# Patient Record
Sex: Female | Born: 1990 | Race: White | Hispanic: No | Marital: Married
Health system: Midwestern US, Academic
[De-identification: ages and names within clinical notes are randomized; demographics above are authoritative.]

---

## 2018-11-06 ENCOUNTER — Encounter: Admit: 2018-11-06 | Discharge: 2018-11-07

## 2018-11-26 ENCOUNTER — Encounter: Admit: 2018-11-26 | Discharge: 2018-11-26 | Payer: Commercial Managed Care - HMO

## 2018-11-26 DIAGNOSIS — M25552 Pain in left hip: Secondary | ICD-10-CM

## 2018-11-26 NOTE — Progress Notes
Date of Service: 11/27/2018    History of Present Illness  Tonya Gonzalez is a 28 y.o. female who presents with c/o left hip pain for approximately 5-7 years.  She is referred by Jackquline Berlin, DO.  She rates her pain 5/10 today.  She is uncertain of an injury or event that preceded the onset of her symptoms.  She localizes the majority of her pain anteriorly deep in the crease of her groin, as well as occasional posterolateral hip discomfort today.  Denies mechanical symptoms, such as catching or locking, as well as instability.  She has noted increasing pain and discomfort, specifically over the past 2-3 months, but her pain has been present for a number of years.  Denies any signs or symptoms of radiculopathy.  Her pain is made worse when sitting for long periods of time, as well as climbing stairs, walking up and down hills, and running activities.  She has recently tried anti-inflammatories, which she feels has provided mild relief, but denies PT and/or injections.  Of note, she brings with her an outside MRI, without contrast, of her left hip for further review today.  Denies distant h/o serious left hip injury and/or dislocations.  She presents to clinic to pursue further management options.       Review of Systems   Musculoskeletal: Positive for arthralgias.     No past medical history on file.    No past surgical history on file.    Objective:         No current outpatient medications on file.     There were no vitals filed for this visit.  There is no height or weight on file to calculate BMI.     Social History     Socioeconomic History   ??? Marital status: Married     Spouse name: Not on file   ??? Number of children: Not on file   ??? Years of education: Not on file   ??? Highest education level: Not on file   Occupational History   ??? Not on file   Tobacco Use   ??? Smoking status: Not on file   Substance and Sexual Activity   ??? Alcohol use: Not on file   ??? Drug use: Not on file ??? Sexual activity: Not on file   Other Topics Concern   ??? Not on file   Social History Narrative   ??? Not on file     General Healthy appearing 28 y.o. female in no acute distress.  Alert and oriented x 3.  Mood and affect are appropriate today.    Constitutional:  She is oriented to person, place, and time.  She appears well-developed and well-nourished.   HENT:   Head: Normocephalic and atraumatic.   Eyes: Conjunctivae and EOM are normal.   Cardiovascular: Normal rate and intact distal pulses.   Pulmonary/Chest: Effort normal. No respiratory distress.   Neurological:  She is alert and oriented to person, place, and time.   Skin: Skin is warm and dry.   Psychiatric:  She has a normal mood and affect.  Her behavior is normal. Judgment and thought content normal.   Nursing note and vitals reviewed.    Physical Exam  Left Hip Exam     Tests   FABER: positive  Ober: negative    Other   Sensation: normal  Pulse: present    Comments:  Level pelvis with single limb stance.  Moderate dynamic valgus with hip abduction and quad dominance.  IR to 30 degrees, ER to 30 degrees, symmetric.  Log roll is positive.  Stinchfield's is negative.  Hip flexion to 130 degrees, without aggravation of anterior hip pain.  FADIR is positive. (Positive impingement sign)  Scour is positive.  Circumduction is positive for pain and snapping.  Resisted adduction is 5/5, with no proximal adductor longus tenderness.  Positive anterior apprehension test.  No tenderness over the pubic symphysis or superior pubic rami.  Resisted crunch is negative.  Mild tenderness over the lateral aspect of the greater trochanter.  Abductor tendons are non-tender.  Abduction to 45 degrees, with negative lateral impingement sign.  Resisted abduction is 5/5 and non-painful.  Hip extension to 20 degrees.  Low back, SI joint, and ischial tuberosity are non-tender.  Piriformis is tender.          Hip Imaging: AP pelvis:  LCEA 17 degrees; Crossover Sign negative; Ischial spine sign positive, moderate anterior joint space narrowing.  Positive posterior wall sign.  Cystic change throughout the femoral head.    left Hip lateral 2 views:  Alpha angle 42    False Profile:  ACEA 17, type I AIIS    An outside MRI, without contrast, of her left hip was reviewed, which indicated subchondral cysts along the left superolateral femoral head with surrounding bone marrow edema which may be related to chronic repetitive trauma.  Clinical correlation is necessary.  Osteonecrosis cannot be absolutely excluded though is not definitively present based on these findings.  Mild to moderate left hip degenerative changes with probable mild left hip dysplasia.  Normal appearing right hip.       Assessment and Plan:    28 y.o.female with  1. Left hip early collapse AVN  2. Moderate left hip DJD  3. Moderate DDH    I discussed the patients history, physical exam, and imaging findings today.  We also discussed treatment options including operative and non-operative management.  Unfortunately she has a very challenging problem at a young age.  Given the above combination of problems, I do not think there is an arthroscopic option that would provide her significant improvement.  A combined hip arthroscopy, core decompression and PAO would be a large surgery to recover from, and would be unlikely to significantly improve the pain she experiences from her moderate arthritis.  I told [name] today that arthroplasty would be the single best procedure to treat all causes of pain in her hip.  Also spent significant time discussing the significance of AVN and possible progression of disease, as well as long term implications to her hip function.  We also discussed the importance of maintaining a consistent low impact exercise program, such as an elliptical machine, stationary bicycle, and/or aquatic therapy.  I offered her a script to start a formal course of PT to establish a consistent HEP, with a particular focus on ROM and core strengthening exercises, and she was in agreement with this treatment plan today.  We also discussed the option of a left hip IA injection under radiology-guidance versus f/u for an in office US-guided IA injection.  If she were interested in an additional consultation regarding hip arthroplasty, we can coordinate that as well.  She does not require a scheduled followup with [place], but certainly can return at any point in the future as her symptoms dictate.  Her case was also discussed with Dr. Robley Fries, her referring provider.  All of her questions were answered to her satisfaction.  She voiced clear understanding of  this treatment plan and agreed.    -Script for Meloxicam.            ATTESTATION    I have taken down these notes in the presence of Dr. Samuel Germany Jaceion Aday.    Staff name:  Vassie Loll Date:  11/26/2018

## 2018-11-27 ENCOUNTER — Ambulatory Visit: Admit: 2018-11-27 | Discharge: 2018-11-27

## 2018-11-27 ENCOUNTER — Encounter: Admit: 2018-11-27 | Discharge: 2018-11-27

## 2018-11-27 DIAGNOSIS — M25552 Pain in left hip: Principal | ICD-10-CM

## 2018-11-27 DIAGNOSIS — M1632 Unilateral osteoarthritis resulting from hip dysplasia, left hip: Secondary | ICD-10-CM

## 2018-11-27 MED ORDER — MELOXICAM 15 MG PO TAB
15 mg | ORAL_TABLET | Freq: Every day | ORAL | 1 refills | 30.00000 days | Status: AC
Start: 2018-11-27 — End: ?

## 2018-11-27 NOTE — Patient Instructions
J. Paul Schroeppel, MD  Corey Whitesides, PA-C  [facility] - Phone [phone] - Fax [phone]   [address], [address] - [place], [place] [address]  Erika Beal, RN - Clinical Nurse Coordinator  Drew Hutchison, ATC - Clinical Athletic Trainer            For up to date information on the COVID-19 virus, visit the CDC website. https://www.cdc.gov/coronavirus   General supportive care during cold and flu season and infection prevention reminders:    o Wash hands often with soap and water for at least 20 seconds   o Cover your mouth and nose   o Social distancing: try to maintain 6 feet between you and other people   o Stay home if sick and symptoms mild or manageable?   If you must be around people wear a mask     If you are having symptoms of a lower respiratory infection (cough, shortness of breath) and/or fever AND either traveled in last 30 days (internationally or to region of exposure) OR known exposure to patient with COVID19:     o Call your primary care provider for questions or health needs.    Tell your doctor about your recent travel and your symptoms     o In a medical emergency, call 911 or go to the nearest emergency room.

## 2018-11-29 ENCOUNTER — Encounter: Admit: 2018-11-29 | Discharge: 2018-11-29

## 2019-02-20 ENCOUNTER — Ambulatory Visit: Admit: 2019-02-20 | Discharge: 2019-02-20 | Payer: Commercial Managed Care - HMO

## 2019-02-20 ENCOUNTER — Encounter: Admit: 2019-02-20 | Discharge: 2019-02-20 | Payer: Commercial Managed Care - HMO

## 2019-02-20 DIAGNOSIS — M1632 Unilateral osteoarthritis resulting from hip dysplasia, left hip: Secondary | ICD-10-CM

## 2019-02-20 NOTE — Progress Notes
ATTESTATION    I personally performed the key portions of the E/M visit, discussed case with my physician assistant, Conception Chancy, PA-C, and concur with her documentation of history, physical exam, assessment, and treatment plan unless otherwise noted.      Staff name:  Cherly Beach, MD Date:  02/20/2019   ________________________________________________________________________________________________________________________________      Orthopaedic Surgery History and Physical - Marquis Lunch, MD    Referring Provider: Wilford Grist    Date of Visit: 02/20/2019       CHIEF COMPLAINT: left hip pain     HISTORY OF PRESENT ILLNESS: Tonya Gonzalez is a 28 y.o. female who presents with left hip pain. The pain has been present since May/June 2020.  It is located in the groin/buttock and described as achey.  It is worst with activity and better with rest. Sleeping and lying down is difficult and achey for her. She states walking around at home is okay, but going on a longer walk with her children is difficult. She doesn't have much confidence in her hip going up and down stairs holding her children. Attempted conservative treatment measures include at home stretching and mobic.  The patient is unable to perform regular tasks or walk any real distance without having to stop because of pain. She would like to have more children and wanted to know if she should have the hip replacement now or later.     Smoker: nonsmoker  Diabetes: nondiabetic  History of MI: no  History of Stroke: no  History of DVT: no        REVIEW OF SYSTEMS:   Review of Systems   General: No fevers chills or night sweats.  HEENT: No recent vision or hearing changes.  No dental concerns.  Resp: No shortness of breath or chest pain.  No cough.  CV: No chest pain or palpitations.  GI: No nausea vomiting, diarrhea, constipation, melena, or hematochezia  GU: No urinary urgency or frequency.  MS: per HPI Neuro: No numbness tingling or weakness distally in the lower extremities.  Heme: No excessive bleeding or clotting  Skin: No open wounds, rashes, or lesions.        PAST MEDICAL HISTORY: No past medical history on file.    PAST SURGICAL HISTORY: No past surgical history on file.    FAMILY HISTORY: No family history on file.    SOCIAL HISTORY:  reports that she has never smoked. She has never used smokeless tobacco. She reports current alcohol use.    MEDICATIONS:   Current Outpatient Medications:   ?  meloxicam (MOBIC) 15 mg tablet, Take one tablet by mouth daily., Disp: 30 tablet, Rfl: 1    ALLERGIES: No Known Allergies    PHYSICAL EXAM:  Vitals:    02/20/19 0915   Resp: 16   SpO2: 100%       Musculoskeletal: Exam of Left Lower Extremity   - Hip ROM: about 50 degrees of internal rotation past neutral & about 30 degrees of external rotation   - Stinchfield: negative, but has pain in the thigh    - Internal rotation over pressure: +     - Straight leg raise: negative    - Strength:  Normal strength in hip flexion, knee extension, ankle dorsiflexion   - Greater trochanter tenderness to palpation: mild    - Feet warm and well perfused.   - 2+ pedal pulses    Constitutional: Alert, awake, and in no acute distress  HEENT:  Normocephalic, atraumatic. Cranial nerves II-XII grossly intact. Sclera anicteric.  Respiratory: Breathing unlabored, no wheezing.  Appropriate rise & fall with inspiration/expiration  Cardiovascular: Regular rate.  No clubbing, cyanosis, or edema peripherally. Palpable pulses bilateral lower extremities.  Skin: No rashes. No ulcers. No open wounds.  Neurological: Sensation intact to light touch in the L4 - S1 distrubition.  5/5 EHL, FHL, Tibialis anterior, and gastrocsoleus complex. 2+ patella tendon reflexes.    Psychiatric: Mood is appropriate.  Judgement and insight seems appropriate.      IMAGING: Imaging reviewed by physician XR pelvis and left hip from 11/27/2018 reveal osteoarthritis of the left hip with ostephyte formation, subchondral sclerosis. Early collapse AVN.     _______________________________________________    DIAGNOSIS:   Left hip pain    ASSESSMENT:   Left hip OA  Left hip early collapse AVN     PLAN:   I had a long discussion with the patient regarding the etiology and treatment for the left hip. We discussed all the options including conservative management, medication, physical therapy, weight loss, bracing, and injection versus operative treatment. Risks, benefits, and potential complications of the variety of treatment options including infection, neurovascular injury, stiffness, wear and longevity of the implant, revision, loosening, fracture, manipulation under anesthesia, clot (DVT, PE), fat embolism, bleeding, transfusion, anesthesia, MI, stroke, and death were discussed.     We talked about her options. She can try a cortisone injection and formal physical therapy to see if this provides some pain relief. It may or may not. We also discussed THA. She is willing to proceed with surgery if that is what is warranted. We talked about her age and how young she is for this type of procedure. We talked about ceramic on ceramic and activity modification after surgery.     Ultimately, she would like to think about the options before she proceeds with any surgery. Her pain is mild to moderate at worst. She will continue doing her stretches at home. She will call if she would like to do proceed with surgery.   ________________________________________________

## 2021-03-18 IMAGING — CR HIPCMLT
3 series · 3 of 3 positions shown · non-contrast
Comparison: none

[hip ap pelvis]
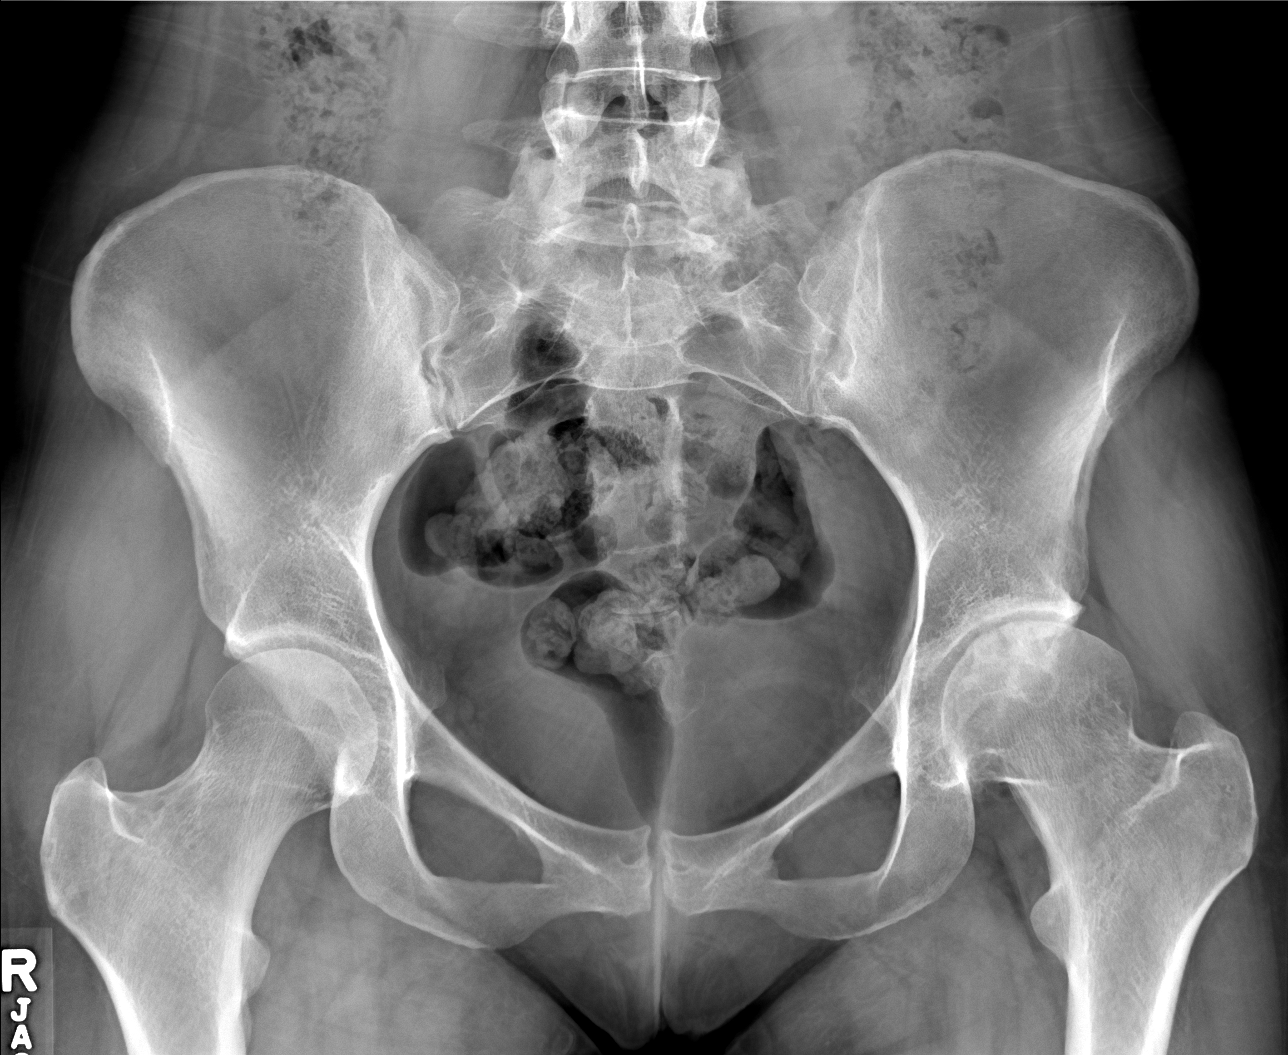

[hip ap]
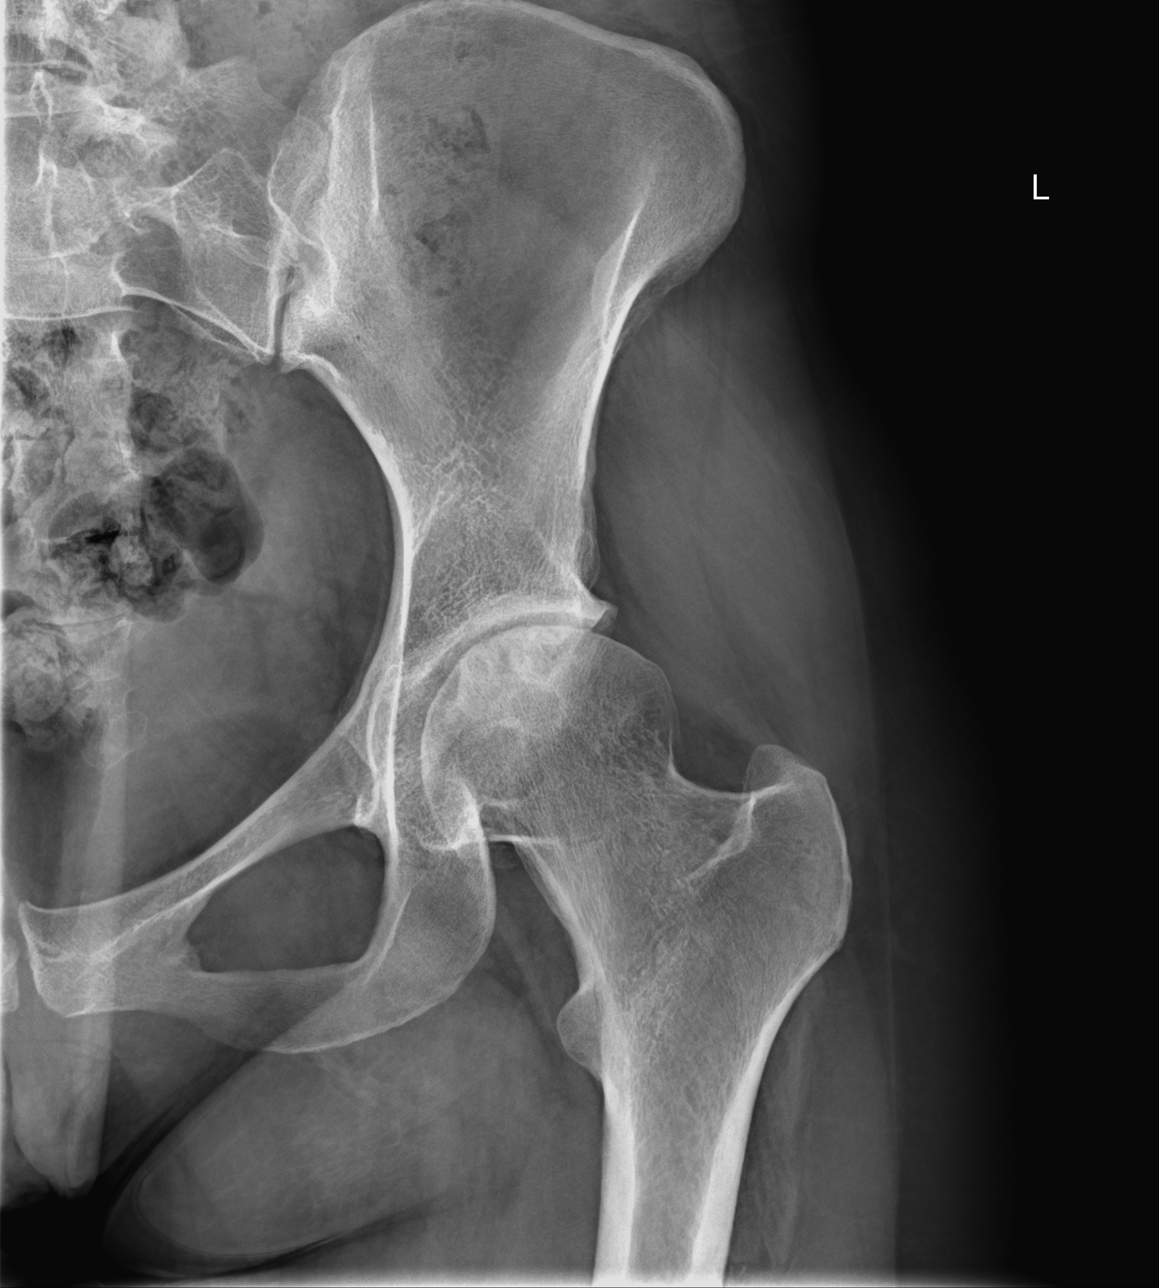

[hip lat]
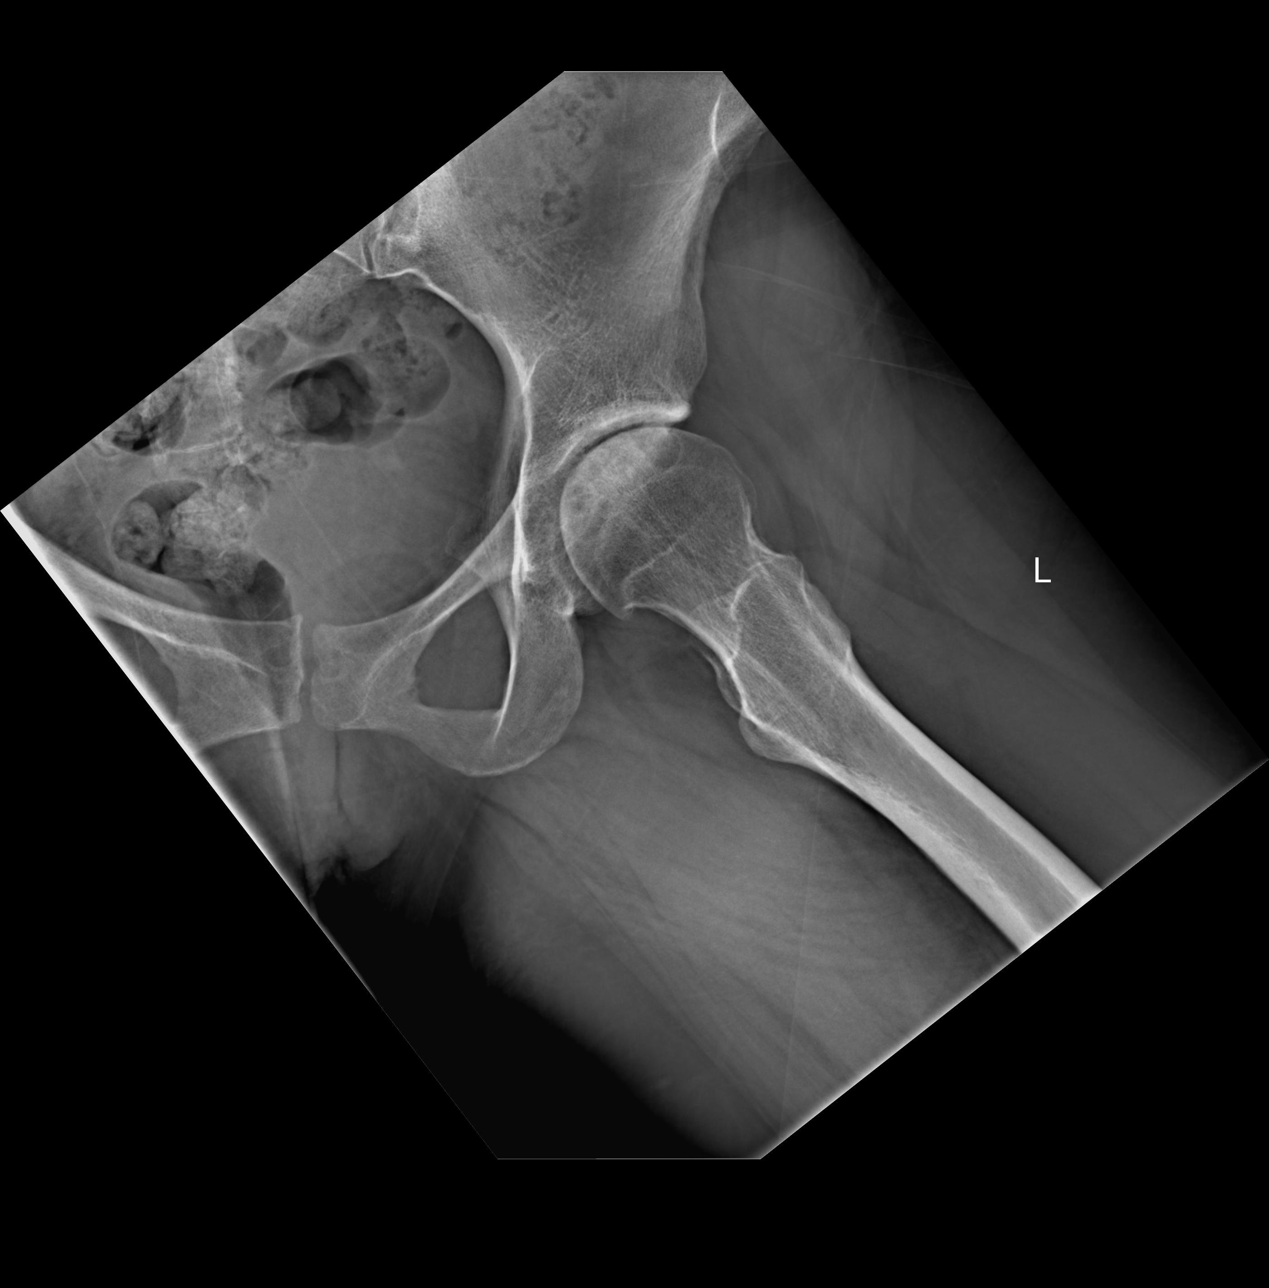

[3 of 3 positions shown; findings below may reference images not displayed]

DIAGNOSTIC STUDIES

EXAM
[Two views left hip

INDICATION
Medial leg pain and gain abnormality
PT c/o medial leg pain x 4 months. No known injury. Denied pregnancy. JC/JL

TECHNIQUE
Single view pelvis with AP and Frogleg views left Hip

COMPARISONS
None available.

FINDINGS
[There is demonstration of evolving osteonecrosis changes of the superolateral aspect of the left
femoral head with grossly preserved rounded femoral morphology. There is evolving joint space loss
and developing marginal osteophyte formation. There is no evidence of displaced fracture or
dislocation. Mild stool seen throughout the colon.

IMPRESSION
1.Demonstration of evolving osteonecrosis changes of the left femoral head with associated early
joint degeneration. Further evaluation with MRI is recommended for improved characterization.

Tech Notes:

PT c/o medial leg pain x 4 months. No known injury. Denied pregnancy. JC/JL

## 2023-05-18 ENCOUNTER — Encounter: Admit: 2023-05-18 | Discharge: 2023-05-18 | Payer: Commercial Managed Care - HMO

## 2023-11-28 ENCOUNTER — Encounter: Admit: 2023-11-28 | Discharge: 2023-11-28 | Payer: PRIVATE HEALTH INSURANCE
